# Patient Record
Sex: Male | Born: 1990 | Race: Black or African American | Hispanic: No | Marital: Single | Smoking: Never smoker
Health system: Southern US, Community
[De-identification: ages and names within clinical notes are randomized; demographics above are authoritative.]

---

## 2017-07-08 DIAGNOSIS — S161XXA Strain of muscle, fascia and tendon at neck level, initial encounter: Secondary | ICD-10-CM | POA: Diagnosis not present

## 2017-07-08 DIAGNOSIS — S29019A Strain of muscle and tendon of unspecified wall of thorax, initial encounter: Secondary | ICD-10-CM | POA: Diagnosis not present

## 2017-07-08 DIAGNOSIS — S39012A Strain of muscle, fascia and tendon of lower back, initial encounter: Secondary | ICD-10-CM | POA: Diagnosis not present

## 2017-10-02 ENCOUNTER — Emergency Department (HOSPITAL_COMMUNITY): Payer: BLUE CROSS/BLUE SHIELD

## 2017-10-02 ENCOUNTER — Encounter (HOSPITAL_COMMUNITY): Payer: Self-pay | Admitting: Emergency Medicine

## 2017-10-02 ENCOUNTER — Emergency Department (HOSPITAL_COMMUNITY)
Admission: EM | Admit: 2017-10-02 | Discharge: 2017-10-02 | Disposition: A | Discharge status: Home / Self Care | Payer: BLUE CROSS/BLUE SHIELD | Attending: Emergency Medicine | Admitting: Emergency Medicine

## 2017-10-02 DIAGNOSIS — R55 Syncope and collapse: Secondary | ICD-10-CM | POA: Diagnosis not present

## 2017-10-02 DIAGNOSIS — R079 Chest pain, unspecified: Secondary | ICD-10-CM | POA: Diagnosis not present

## 2017-10-02 LAB — BASIC METABOLIC PANEL
Anion gap: 8 (ref 5–15)
BUN: 13 mg/dL (ref 6–20)
CHLORIDE: 103 mmol/L (ref 98–111)
CO2: 27 mmol/L (ref 22–32)
CREATININE: 1.03 mg/dL (ref 0.61–1.24)
Calcium: 9.3 mg/dL (ref 8.9–10.3)
GFR calc non Af Amer: >60 mL/min (ref >60)
Glucose, Bld: 89 mg/dL (ref 70–99)
POTASSIUM: 3.6 mmol/L (ref 3.5–5.1)
SODIUM: 138 mmol/L (ref 135–145)

## 2017-10-02 LAB — URINALYSIS, ROUTINE W REFLEX MICROSCOPIC
BILIRUBIN URINE: NEGATIVE
Glucose, UA: NEGATIVE mg/dL
HGB URINE DIPSTICK: NEGATIVE
Ketones, ur: 5 mg/dL — AB
LEUKOCYTES UA: NEGATIVE
Nitrite: NEGATIVE
PH: 5 (ref 5.0–8.0)
PROTEIN: NEGATIVE mg/dL
SPECIFIC GRAVITY, URINE: 1.028 (ref 1.005–1.030)

## 2017-10-02 LAB — CBC
HEMATOCRIT: 47.6 % (ref 39.0–52.0)
HEMOGLOBIN: 15.9 g/dL (ref 13.0–17.0)
MCH: 31.9 pg (ref 26.0–34.0)
MCHC: 33.4 g/dL (ref 30.0–36.0)
MCV: 95.4 fL (ref 78.0–100.0)
PLATELETS: 263 10*3/uL (ref 150–400)
RBC: 4.99 MIL/uL (ref 4.22–5.81)
RDW: 12.4 % (ref 11.5–15.5)
WBC: 5.5 10*3/uL (ref 4.0–10.5)

## 2017-10-02 LAB — CBG MONITORING, ED: GLUCOSE-CAPILLARY: 81 mg/dL (ref 70–99)

## 2017-10-02 LAB — TROPONIN I

## 2017-10-02 LAB — D-DIMER, QUANTITATIVE: D-Dimer, Quant: 0.27 ug/mL-FEU (ref 0.00–0.50)

## 2017-10-02 NOTE — ED Triage Notes (Signed)
Patient here from home with complaints of syncopal episode yesterday. Hypertensive. Reports that he was feeling fine after.

## 2017-10-02 NOTE — ED Provider Notes (Signed)
[facility]-EMERGENCY DEPT Provider Note   CSN: [id number] Arrival date & time: 10/02/17  1710     History   Chief Complaint Chief Complaint  Patient presents with  . Near Syncope    HPI Lee Valdez He is a 27 y.o. male.  HPI Patient presents with witnessed syncopal episode yesterday evening while getting his haircut.  States he felt lightheaded with tunnel vision.  Slumped over and had brief syncopal episode.  No known head trauma.  Patient had brief shaking episode and then regained consciousness.  No postictal phase.  No intraoral trauma.  No incontinence.  No previous syncopal history.  States that she has had episodic central chest tightness which he felt was related to indigestion.  Denies any new lower extremity swelling or pain.  No recent extended travel or immobilization. History reviewed. No pertinent past medical history.  There are no active problems to display for this patient.   History reviewed. No pertinent surgical history.      Home Medications    Prior to Admission medications   Medication Sig Start Date End Date Taking? Authorizing Provider  diclofenac (VOLTAREN) 75 MG EC tablet Take 75 mg by mouth 2 (two) times daily as needed. for pain 07/08/17   [provider]  methocarbamol (ROBAXIN) 500 MG tablet TAKE 2 TABLETS BY MOUTH 4 TIMES A DAY AS NEEDED FOR MUSCLE SPASM 07/08/17   [provider]    Family History No family history on file.  Social History Social History   Tobacco Use  . Smoking status: Never Smoker  . Smokeless tobacco: Never Used  Substance Use Topics  . Alcohol use: Never    Frequency: Never  . Drug use: Never     Allergies   Patient has no known allergies.   Review of Systems Review of Systems  Constitutional: Positive for diaphoresis. Negative for chills and fever.  HENT: Negative for facial swelling and trouble swallowing.   Eyes: Positive for visual disturbance.  Respiratory:  Negative for cough and shortness of breath.   Cardiovascular: Positive for chest pain. Negative for palpitations and leg swelling.  Gastrointestinal: Negative for abdominal pain, constipation, diarrhea, nausea and vomiting.  Genitourinary: Negative for dysuria, frequency and hematuria.  Musculoskeletal: Negative for back pain, myalgias and neck pain.  Skin: Negative for rash and wound.  Neurological: Positive for syncope and light-headedness. Negative for facial asymmetry, weakness, numbness and headaches.  All other systems reviewed and are negative.    Physical Exam Updated Vital Signs BP (!) 154/97   Pulse 61   Temp 98.1 F (36.7 C) (Oral)   Resp 15   SpO2 100%   Physical Exam  Constitutional: He is oriented to person, place, and time. He appears well-developed and well-nourished. No distress.  HENT:  Head: Normocephalic and atraumatic.  Mouth/Throat: Oropharynx is clear and moist.  No intraoral trauma.  No scalp trauma.  Midface is stable.  Renal nerves II through XII grossly intact.  Eyes: Pupils are equal, round, and reactive to light. EOM are normal.  Neck: Normal range of motion. Neck supple.  No posterior midline cervical tenderness to palpation.  No meningismus.  Cardiovascular: Normal rate and regular rhythm. Exam reveals no gallop and no friction rub.  No murmur heard. Pulmonary/Chest: Effort normal and breath sounds normal. No respiratory distress. He has no wheezes. He has no rales.  Abdominal: Soft. Bowel sounds are normal. He exhibits no distension and no mass. There is no tenderness. There is no rebound  and no guarding.  Musculoskeletal: Normal range of motion. He exhibits no edema or tenderness.  No lower extremity swelling, asymmetry or tenderness.  Distal pulses are 2+.  No midline thoracic or lumbar tenderness.  No CVA tenderness.  Neurological: He is alert and oriented to person, place, and time.  Moves all extremities without focal deficit.  Sensation  intact.  Skin: Skin is warm and dry. Capillary refill takes less than 2 seconds. No rash noted. He is not diaphoretic. No erythema.  Psychiatric: He has a normal mood and affect. His behavior is normal.  Nursing note and vitals reviewed.    ED Treatments / Results  Labs (all labs ordered are listed, but only abnormal results are displayed) Labs Reviewed  URINALYSIS, ROUTINE W REFLEX MICROSCOPIC - Abnormal; Notable for the following components:      Result Value   Ketones, ur 5 (*)    All other components within normal limits  BASIC METABOLIC PANEL  CBC  TROPONIN I  D-DIMER, QUANTITATIVE (NOT AT [facility])  CBG MONITORING, ED    EKG EKG Interpretation  Date/Time:  Sunday October 02 2017 17:49:39 EDT Ventricular Rate:  60 PR Interval:    QRS Duration: 84 QT Interval:  371 QTC Calculation: 371 R Axis:   96 Text Interpretation:  Sinus rhythm Atrial premature complex Borderline right axis deviation ST elev, probable normal early repol pattern Baseline wander in lead(s) V1 Confirmed by Loren Racer ([id number]) on 10/02/2017 8:24:08 PM   Radiology Dg Chest 2 View  Result Date: 10/02/2017 CLINICAL DATA:  Chest pain and syncope. EXAM: CHEST - 2 VIEW COMPARISON:  None. FINDINGS: The heart size and mediastinal contours are within normal limits. Both lungs are clear. The visualized skeletal structures are unremarkable. IMPRESSION: No active cardiopulmonary disease. Electronically Signed   By: Obie Dredge M.D.   On: 10/02/2017 22:00    Procedures Procedures (including critical care time)  Medications Ordered in ED Medications - No data to display   Initial Impression / Assessment and Plan / ED Course  I have reviewed the triage vital signs and the nursing notes.  Pertinent labs & imaging results that were available during my care of the patient were reviewed by me and considered in my medical decision making (see chart for details).     Patient remains asymptomatic.  Does have a  drop in his blood pressure with standing.  Question mild dehydration.  Patient does have a change on EKG is associated with chronic hypertension.  Normal d-dimer and troponin.  He is advised to follow-up closely with cardiology return precautions have been given.  Final Clinical Impressions(s) / ED Diagnoses   Final diagnoses:  Syncope and collapse    ED Discharge Orders    None       Loren Racer, MD 10/02/17 [id number]

## 2017-12-29 DIAGNOSIS — Z23 Encounter for immunization: Secondary | ICD-10-CM | POA: Diagnosis not present

## 2017-12-29 DIAGNOSIS — Z1389 Encounter for screening for other disorder: Secondary | ICD-10-CM | POA: Diagnosis not present

## 2017-12-29 DIAGNOSIS — Z136 Encounter for screening for cardiovascular disorders: Secondary | ICD-10-CM | POA: Diagnosis not present

## 2017-12-29 DIAGNOSIS — Z0000 Encounter for general adult medical examination without abnormal findings: Secondary | ICD-10-CM | POA: Diagnosis not present

## 2018-04-20 DIAGNOSIS — J069 Acute upper respiratory infection, unspecified: Secondary | ICD-10-CM | POA: Diagnosis not present

## 2018-05-08 ENCOUNTER — Ambulatory Visit
Admission: RE | Admit: 2018-05-08 | Discharge: 2018-05-08 | Disposition: A | Discharge status: Home / Self Care | Payer: BLUE CROSS/BLUE SHIELD | Source: Ambulatory Visit | Attending: Internal Medicine | Admitting: Internal Medicine

## 2018-05-08 ENCOUNTER — Other Ambulatory Visit: Payer: Self-pay | Admitting: Internal Medicine

## 2018-05-08 DIAGNOSIS — R030 Elevated blood-pressure reading, without diagnosis of hypertension: Secondary | ICD-10-CM | POA: Diagnosis not present

## 2018-05-08 DIAGNOSIS — R05 Cough: Secondary | ICD-10-CM | POA: Diagnosis not present

## 2018-05-08 DIAGNOSIS — R059 Cough, unspecified: Secondary | ICD-10-CM

## 2018-10-19 DIAGNOSIS — Z20828 Contact with and (suspected) exposure to other viral communicable diseases: Secondary | ICD-10-CM | POA: Diagnosis not present

## 2019-03-03 IMAGING — DX DG CHEST 2V
2 series · 2 of 2 positions shown · non-contrast
Comparison: October 02, 2017

CLINICAL DATA: Cough for 3 weeks.

EXAM:
CHEST - 2 VIEW

[dg chest 2 view (1 of 2)]
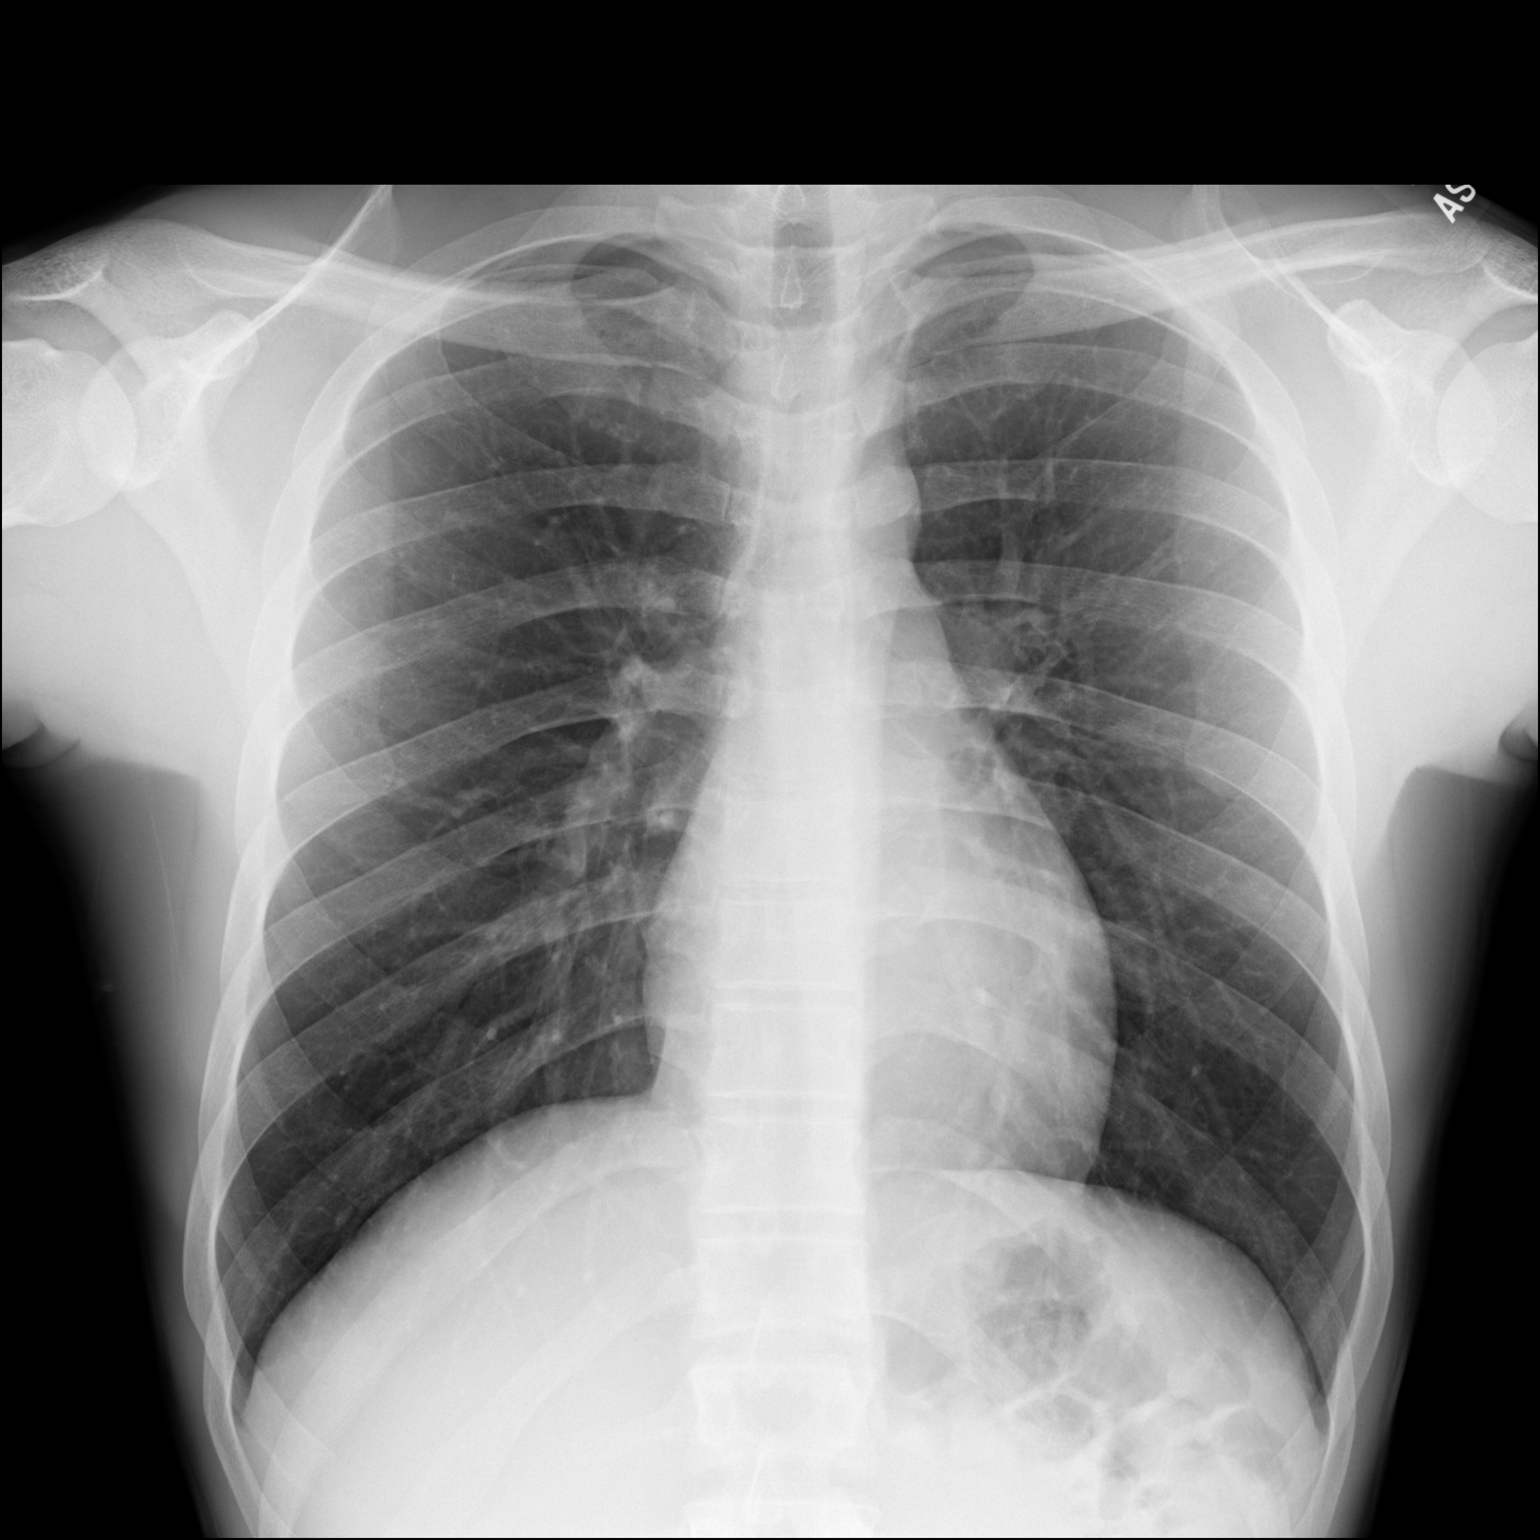

[dg chest 2 view (2 of 2)]
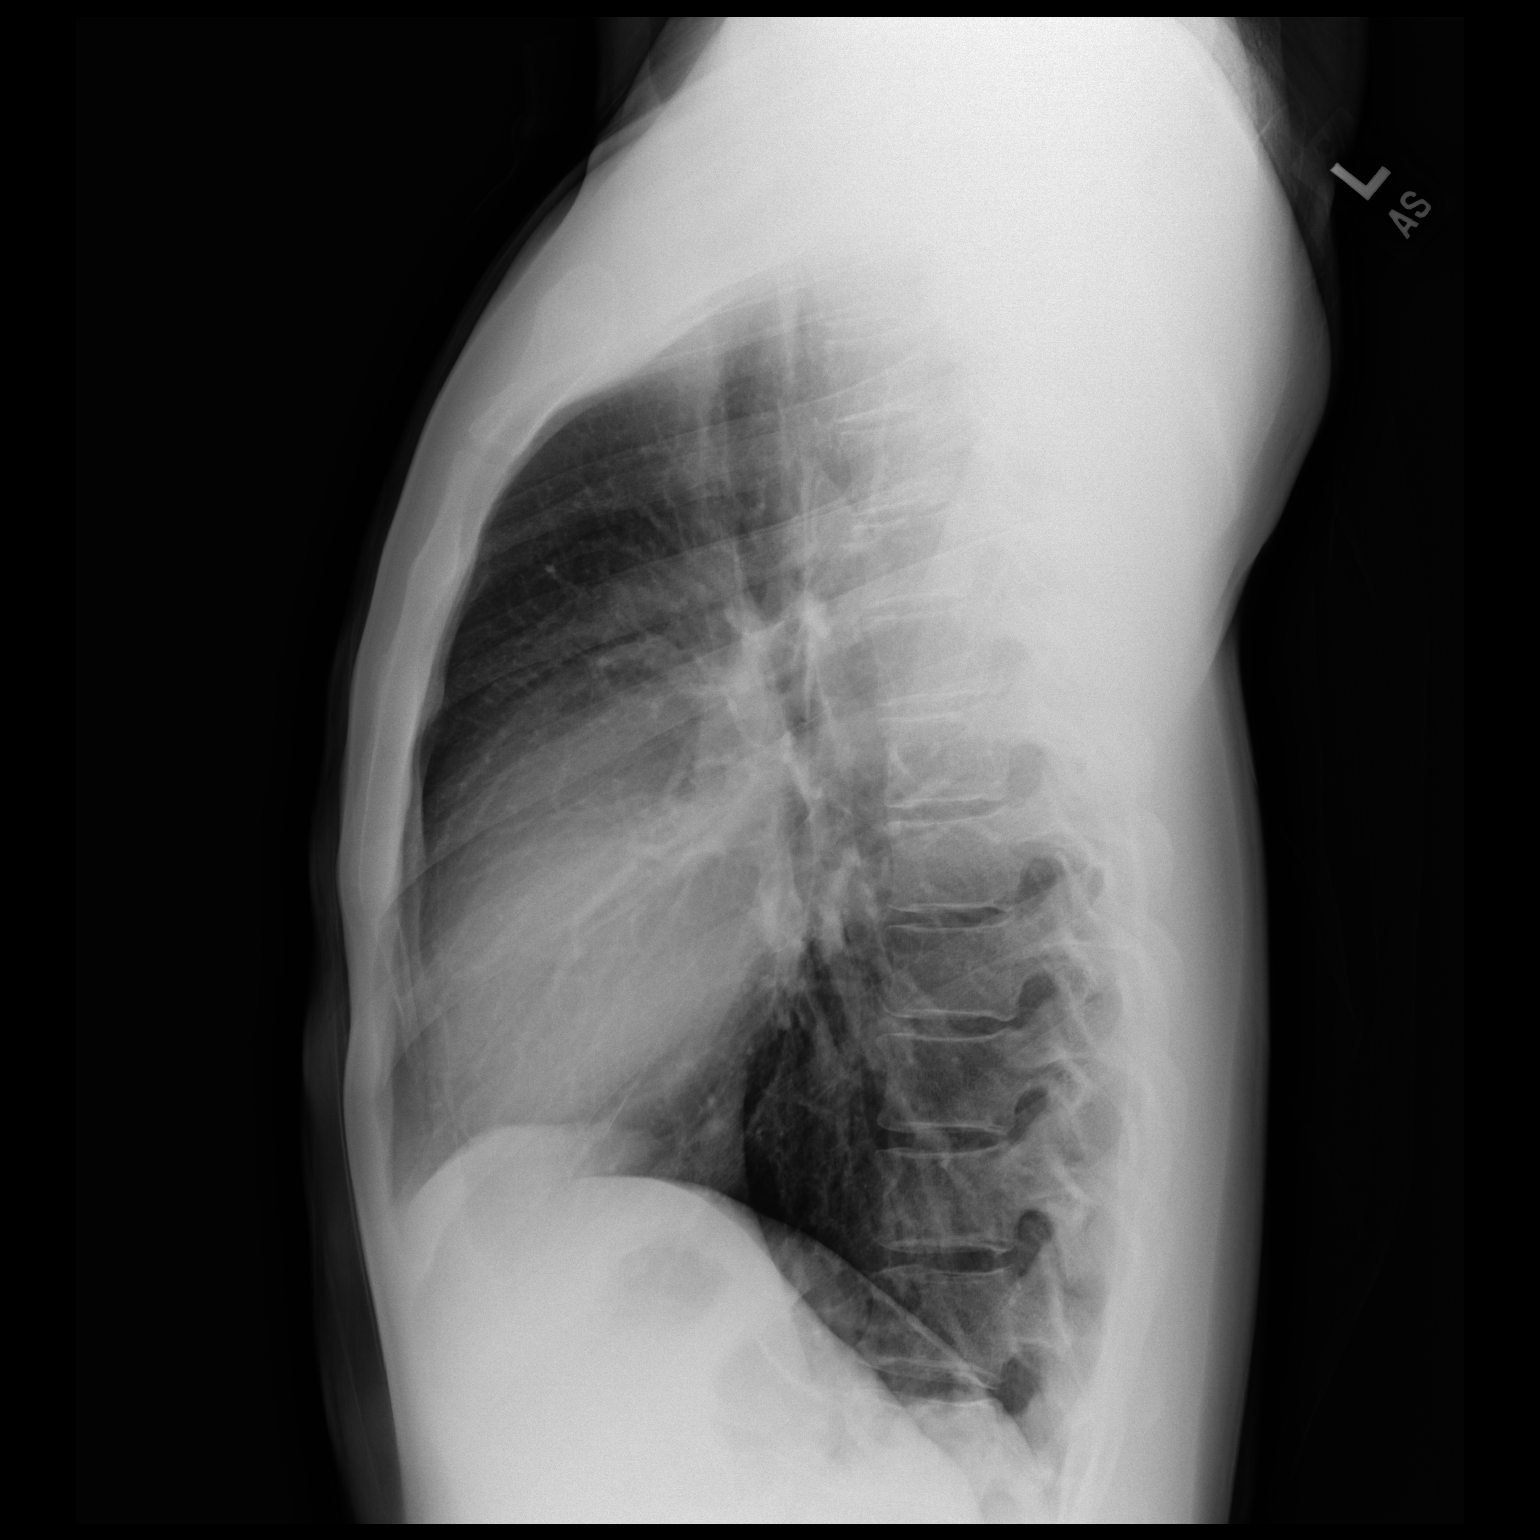

[2 of 2 positions shown; findings below may reference images not displayed]

FINDINGS: The heart size and mediastinal contours are within normal limits.
Both lungs are clear. The visualized skeletal structures are
unremarkable.
IMPRESSION: No active cardiopulmonary disease.
